# Patient Record
Sex: Male | Born: 1970 | Race: White | Hispanic: No | Marital: Married | Smoking: Never smoker
Health system: Southern US, Community
[De-identification: ages and names within clinical notes are randomized; demographics above are authoritative.]

## PROBLEM LIST (undated history)

## (undated) DIAGNOSIS — M1990 Unspecified osteoarthritis, unspecified site: Secondary | ICD-10-CM

## (undated) DIAGNOSIS — I10 Essential (primary) hypertension: Secondary | ICD-10-CM

## (undated) DIAGNOSIS — E119 Type 2 diabetes mellitus without complications: Secondary | ICD-10-CM

---

## 2000-02-27 ENCOUNTER — Emergency Department (HOSPITAL_COMMUNITY): Admission: EM | Admit: 2000-02-27 | Discharge: 2000-02-27 | Payer: Self-pay | Admitting: Emergency Medicine

## 2000-02-27 ENCOUNTER — Encounter: Payer: Self-pay | Admitting: Emergency Medicine

## 2000-04-08 HISTORY — PX: CYST EXCISION: SHX5701

## 2004-08-22 ENCOUNTER — Ambulatory Visit (HOSPITAL_COMMUNITY): Admission: RE | Admit: 2004-08-22 | Discharge: 2004-08-22 | Payer: Self-pay | Admitting: Orthopedic Surgery

## 2004-08-22 ENCOUNTER — Ambulatory Visit (HOSPITAL_BASED_OUTPATIENT_CLINIC_OR_DEPARTMENT_OTHER): Admission: RE | Admit: 2004-08-22 | Discharge: 2004-08-22 | Payer: Self-pay | Admitting: Orthopedic Surgery

## 2014-12-21 ENCOUNTER — Emergency Department: Payer: Worker's Compensation

## 2014-12-21 ENCOUNTER — Encounter: Payer: Self-pay | Admitting: Emergency Medicine

## 2014-12-21 ENCOUNTER — Emergency Department
Admission: EM | Admit: 2014-12-21 | Discharge: 2014-12-21 | Discharge status: Against Medical Advice | Payer: Worker's Compensation | Attending: Student | Admitting: Student

## 2014-12-21 DIAGNOSIS — S8002XA Contusion of left knee, initial encounter: Secondary | ICD-10-CM | POA: Diagnosis not present

## 2014-12-21 DIAGNOSIS — Y9389 Activity, other specified: Secondary | ICD-10-CM | POA: Insufficient documentation

## 2014-12-21 DIAGNOSIS — S8992XA Unspecified injury of left lower leg, initial encounter: Secondary | ICD-10-CM | POA: Diagnosis present

## 2014-12-21 DIAGNOSIS — Y990 Civilian activity done for income or pay: Secondary | ICD-10-CM | POA: Diagnosis not present

## 2014-12-21 DIAGNOSIS — W2209XA Striking against other stationary object, initial encounter: Secondary | ICD-10-CM | POA: Diagnosis not present

## 2014-12-21 DIAGNOSIS — Y9289 Other specified places as the place of occurrence of the external cause: Secondary | ICD-10-CM | POA: Insufficient documentation

## 2014-12-21 MED ORDER — TRAMADOL HCL 50 MG PO TABS: 50.0000 mg | ORAL_TABLET | Freq: Four times a day (QID) | ORAL | Status: DC | PRN

## 2014-12-21 MED ORDER — NAPROXEN 500 MG PO TABS: 500.0000 mg | ORAL_TABLET | Freq: Two times a day (BID) | ORAL | Status: DC

## 2014-12-21 NOTE — Discharge Instructions (Signed)
Ambulate with crutches for 3-5 days. Take medications as directed. Follow up with Ortho Doctor if no improvement in 2-3 days.

## 2014-12-21 NOTE — ED Provider Notes (Signed)
[facility] Emergency Department Provider Note  ____________________________________________  Time seen: Approximately 2:12 PM  I have reviewed the triage vital signs and the nursing notes.   HISTORY  Chief Complaint Knee Pain    HPI William Huang is a 44 y.o. male left knee pain secondary to contusion. Patient stated while loading 80 pound tire to the truck rolled off and hit the lateral aspect of his left knee causes knee to buckle.Patient went to urgent care clinic had x-rays done and told him was not broken but he continued having pain and edema. Patient rated his pain as a 6/10. Patient is wearing  Ace wrap around the knee.   History reviewed. No pertinent past medical history.  There are no active problems to display for this patient.   History reviewed. No pertinent past surgical history.  No current outpatient prescriptions on file.  Allergies Review of patient's allergies indicates no known allergies.  History reviewed. No pertinent family history.  Social History Social History  Substance Use Topics  . Smoking status: Never Smoker   . Smokeless tobacco: None  . Alcohol Use: None    Review of Systems Constitutional: No fever/chills Eyes: No visual changes. ENT: No sore throat. Cardiovascular: Denies chest pain. Respiratory: Denies shortness of breath. Gastrointestinal: No abdominal pain.  No nausea, no vomiting.  No diarrhea.  No constipation. Genitourinary: Negative for dysuria. Musculoskeletal: Left knee pain  Skin: Negative for rash. Neurological: Negative for headaches, focal weakness or numbness. ____________________________________________   PHYSICAL EXAM:  VITAL SIGNS: ED Triage Vitals  Enc Vitals Group     BP --      Pulse Rate 12/21/14 1340 93     Resp 12/21/14 1340 18     Temp 12/21/14 1340 97.7 F (36.5 C)     Temp Source 12/21/14 1340 Oral     SpO2 12/21/14 1340 93 %     Weight 12/21/14 1340 245 lb  (111.131 kg)     Height 12/21/14 1340  (1.727 m)     Head Cir --      Peak Flow --      Pain Score 12/21/14 1341 6     Pain Loc --      Pain Edu? --      Excl. in GC? --     Constitutional: Alert and oriented. Well appearing and in no acute distress. Eyes: Conjunctivae are normal. PERRL. EOMI. Head: Atraumatic. Nose: No congestion/rhinnorhea. Mouth/Throat: Mucous membranes are moist.  Oropharynx non-erythematous. Neck: No stridor.   Hematological/Lymphatic/Immunilogical: No cervical lymphadenopathy. Cardiovascular: Normal rate, regular rhythm. Grossly normal heart sounds.  Good peripheral circulation. Respiratory: Normal respiratory effort.  No retractions. Lungs CTAB. Gastrointestinal: Soft and nontender. No distention. No abdominal bruits. No CVA tenderness. Genitourinary:  Musculoskeletal: No lower extremity tenderness nor edema.  No joint effusions. Neurologic:  Normal speech and language. No gross focal neurologic deficits are appreciated. No gait instability. Skin:  Skin is warm, dry and intact. No rash noted. Psychiatric: Mood and affect are normal. Speech and behavior are normal.  ____________________________________________   LABS (all labs ordered are listed, but only abnormal results are displayed)  Labs Reviewed - No data to display ____________________________________________  EKG   ____________________________________________  RADIOLOGY  No acute findings on x-ray. ____________________________________________   PROCEDURES  Procedure(s) performed: None  Critical Care performed: No  ____________________________________________   INITIAL IMPRESSION / ASSESSMENT AND PLAN / ED COURSE  Pertinent labs & imaging results that were available during my  care of the patient were reviewed by me and considered in my medical decision making (see chart for details). Knee contusion. Patient left before x-ray readings were available. Patient state he will  return after he takes his daughter to the dentist. ____________________________________________   FINAL CLINICAL IMPRESSION(S) / ED DIAGNOSES  Final diagnoses:  Knee contusion, left, initial encounter      Joni Reining, PA-C 12/21/14 1601  Joni Reining, PA-C 12/21/14 1606  Gayla Doss, MD 12/22/14 [phone]

## 2014-12-21 NOTE — ED Notes (Signed)
Reports injuring left knee at work.

## 2014-12-21 NOTE — ED Notes (Signed)
Pt had prior event needs to go to at this time and can no longer wait. Pt left at this time AMA.

## 2016-02-12 ENCOUNTER — Other Ambulatory Visit: Payer: Self-pay | Admitting: Orthopedic Surgery

## 2016-02-12 DIAGNOSIS — M25562 Pain in left knee: Secondary | ICD-10-CM

## 2016-02-20 ENCOUNTER — Ambulatory Visit
Admission: RE | Admit: 2016-02-20 | Discharge: 2016-02-20 | Disposition: A | Discharge status: Home / Self Care | Payer: Worker's Compensation | Source: Ambulatory Visit | Attending: Orthopedic Surgery | Admitting: Orthopedic Surgery

## 2016-02-20 DIAGNOSIS — M25562 Pain in left knee: Secondary | ICD-10-CM

## 2016-05-09 HISTORY — PX: KNEE ARTHROSCOPY W/ MENISCAL REPAIR: SHX1877

## 2017-01-02 ENCOUNTER — Ambulatory Visit (HOSPITAL_BASED_OUTPATIENT_CLINIC_OR_DEPARTMENT_OTHER): Payer: Worker's Compensation | Admitting: Anesthesiology

## 2017-01-02 ENCOUNTER — Ambulatory Visit (HOSPITAL_BASED_OUTPATIENT_CLINIC_OR_DEPARTMENT_OTHER)
Admission: RE | Admit: 2017-01-02 | Discharge: 2017-01-02 | Disposition: A | Discharge status: Home / Self Care | Payer: Worker's Compensation | Source: Ambulatory Visit | Attending: Orthopedic Surgery | Admitting: Orthopedic Surgery

## 2017-01-02 ENCOUNTER — Other Ambulatory Visit: Payer: Self-pay | Admitting: Orthopedic Surgery

## 2017-01-02 ENCOUNTER — Encounter (HOSPITAL_BASED_OUTPATIENT_CLINIC_OR_DEPARTMENT_OTHER): Payer: Self-pay | Admitting: Anesthesiology

## 2017-01-02 ENCOUNTER — Encounter (HOSPITAL_BASED_OUTPATIENT_CLINIC_OR_DEPARTMENT_OTHER)
Admission: RE | Disposition: A | Discharge status: Home / Self Care | Payer: Self-pay | Source: Ambulatory Visit | Attending: Orthopedic Surgery

## 2017-01-02 DIAGNOSIS — Z7982 Long term (current) use of aspirin: Secondary | ICD-10-CM | POA: Diagnosis not present

## 2017-01-02 DIAGNOSIS — S62617A Displaced fracture of proximal phalanx of left little finger, initial encounter for closed fracture: Secondary | ICD-10-CM | POA: Diagnosis not present

## 2017-01-02 DIAGNOSIS — I10 Essential (primary) hypertension: Secondary | ICD-10-CM | POA: Insufficient documentation

## 2017-01-02 DIAGNOSIS — Z79891 Long term (current) use of opiate analgesic: Secondary | ICD-10-CM | POA: Insufficient documentation

## 2017-01-02 DIAGNOSIS — Z791 Long term (current) use of non-steroidal anti-inflammatories (NSAID): Secondary | ICD-10-CM | POA: Diagnosis not present

## 2017-01-02 DIAGNOSIS — E119 Type 2 diabetes mellitus without complications: Secondary | ICD-10-CM | POA: Insufficient documentation

## 2017-01-02 DIAGNOSIS — Z79899 Other long term (current) drug therapy: Secondary | ICD-10-CM | POA: Insufficient documentation

## 2017-01-02 DIAGNOSIS — Y9289 Other specified places as the place of occurrence of the external cause: Secondary | ICD-10-CM | POA: Diagnosis not present

## 2017-01-02 DIAGNOSIS — W228XXA Striking against or struck by other objects, initial encounter: Secondary | ICD-10-CM | POA: Diagnosis not present

## 2017-01-02 HISTORY — DX: Type 2 diabetes mellitus without complications: E11.9

## 2017-01-02 HISTORY — PX: CLOSED REDUCTION FINGER WITH PERCUTANEOUS PINNING: SHX5612

## 2017-01-02 HISTORY — DX: Essential (primary) hypertension: I10

## 2017-01-02 LAB — GLUCOSE, CAPILLARY
Glucose-Capillary: 250 mg/dL — ABNORMAL HIGH (ref 65–99)
Glucose-Capillary: 222 mg/dL — ABNORMAL HIGH (ref 65–99)

## 2017-01-02 SURGERY — CLOSED REDUCTION, FINGER, WITH PERCUTANEOUS PINNING
Anesthesia: General | Site: Hand | Laterality: Left

## 2017-01-02 MED ORDER — CEFAZOLIN SODIUM-DEXTROSE 2-4 GM/100ML-% IV SOLN
2.0000 g | INTRAVENOUS | Status: AC
  Administered 2017-01-02: 2 g via INTRAVENOUS

## 2017-01-02 MED ORDER — DEXAMETHASONE SODIUM PHOSPHATE 10 MG/ML IJ SOLN
INTRAMUSCULAR | Status: DC | PRN
  Administered 2017-01-02: 5 mg via INTRAVENOUS

## 2017-01-02 MED ORDER — CELECOXIB 200 MG PO CAPS
ORAL_CAPSULE | ORAL | Status: AC
  Filled 2017-01-02: qty 2

## 2017-01-02 MED ORDER — MIDAZOLAM HCL 2 MG/2ML IJ SOLN
INTRAMUSCULAR | Status: AC
  Filled 2017-01-02: qty 2

## 2017-01-02 MED ORDER — CEFAZOLIN SODIUM-DEXTROSE 2-4 GM/100ML-% IV SOLN
INTRAVENOUS | Status: AC
  Filled 2017-01-02: qty 100

## 2017-01-02 MED ORDER — SCOPOLAMINE 1 MG/3DAYS TD PT72: 1.0000 | MEDICATED_PATCH | Freq: Once | TRANSDERMAL | Status: DC | PRN

## 2017-01-02 MED ORDER — DEXAMETHASONE SODIUM PHOSPHATE 10 MG/ML IJ SOLN
INTRAMUSCULAR | Status: AC
  Filled 2017-01-02: qty 1

## 2017-01-02 MED ORDER — FENTANYL CITRATE (PF) 100 MCG/2ML IJ SOLN: 25.0000 ug | INTRAMUSCULAR | Status: DC | PRN

## 2017-01-02 MED ORDER — GABAPENTIN 300 MG PO CAPS
ORAL_CAPSULE | ORAL | Status: AC
  Filled 2017-01-02: qty 1

## 2017-01-02 MED ORDER — BUPIVACAINE HCL (PF) 0.25 % IJ SOLN
INTRAMUSCULAR | Status: AC
  Filled 2017-01-02: qty 30

## 2017-01-02 MED ORDER — GABAPENTIN 300 MG PO CAPS
300.0000 mg | ORAL_CAPSULE | Freq: Once | ORAL | Status: AC
  Administered 2017-01-02: 300 mg via ORAL

## 2017-01-02 MED ORDER — CHLORHEXIDINE GLUCONATE 4 % EX LIQD: 60.0000 mL | Freq: Once | CUTANEOUS | Status: DC

## 2017-01-02 MED ORDER — MIDAZOLAM HCL 2 MG/2ML IJ SOLN
1.0000 mg | INTRAMUSCULAR | Status: DC | PRN
  Administered 2017-01-02: 2 mg via INTRAVENOUS

## 2017-01-02 MED ORDER — ONDANSETRON HCL 4 MG/2ML IJ SOLN
INTRAMUSCULAR | Status: AC
  Filled 2017-01-02: qty 2

## 2017-01-02 MED ORDER — HYDROCODONE-ACETAMINOPHEN 5-325 MG PO TABS: ORAL_TABLET | ORAL | 0 refills | Status: AC

## 2017-01-02 MED ORDER — CELECOXIB 400 MG PO CAPS
400.0000 mg | ORAL_CAPSULE | Freq: Once | ORAL | Status: AC
  Administered 2017-01-02: 400 mg via ORAL

## 2017-01-02 MED ORDER — LACTATED RINGERS IV SOLN
INTRAVENOUS | Status: DC
  Administered 2017-01-02 (×2): via INTRAVENOUS

## 2017-01-02 MED ORDER — BUPIVACAINE HCL (PF) 0.25 % IJ SOLN
INTRAMUSCULAR | Status: DC | PRN
  Administered 2017-01-02: 8 mL

## 2017-01-02 MED ORDER — FENTANYL CITRATE (PF) 100 MCG/2ML IJ SOLN
INTRAMUSCULAR | Status: AC
  Filled 2017-01-02: qty 2

## 2017-01-02 MED ORDER — LIDOCAINE HCL (CARDIAC) 20 MG/ML IV SOLN
INTRAVENOUS | Status: DC | PRN
  Administered 2017-01-02: 40 mg via INTRAVENOUS

## 2017-01-02 MED ORDER — FENTANYL CITRATE (PF) 100 MCG/2ML IJ SOLN
50.0000 ug | INTRAMUSCULAR | Status: DC | PRN
  Administered 2017-01-02: 100 ug via INTRAVENOUS

## 2017-01-02 MED ORDER — PROPOFOL 10 MG/ML IV BOLUS
INTRAVENOUS | Status: DC | PRN
  Administered 2017-01-02: 200 mg via INTRAVENOUS

## 2017-01-02 MED ORDER — PROMETHAZINE HCL 25 MG/ML IJ SOLN: 6.2500 mg | INTRAMUSCULAR | Status: DC | PRN

## 2017-01-02 MED ORDER — ONDANSETRON HCL 4 MG/2ML IJ SOLN
INTRAMUSCULAR | Status: DC | PRN
  Administered 2017-01-02: 4 mg via INTRAVENOUS

## 2017-01-02 MED ORDER — ACETAMINOPHEN 500 MG PO TABS
ORAL_TABLET | ORAL | Status: AC
  Filled 2017-01-02: qty 2

## 2017-01-02 MED ORDER — ACETAMINOPHEN 500 MG PO TABS
1000.0000 mg | ORAL_TABLET | Freq: Once | ORAL | Status: AC
  Administered 2017-01-02: 1000 mg via ORAL

## 2017-01-02 SURGICAL SUPPLY — 55 items
BANDAGE ACE 3X5.8 VEL STRL LF (GAUZE/BANDAGES/DRESSINGS) ×1 IMPLANT
BLADE SURG 15 STRL LF DISP TIS (BLADE) ×4 IMPLANT
BLADE SURG 15 STRL SS (BLADE) ×6
BNDG CMPR 9X4 STRL LF SNTH (GAUZE/BANDAGES/DRESSINGS) ×2
BNDG ELASTIC 2X5.8 VLCR STR LF (GAUZE/BANDAGES/DRESSINGS) IMPLANT
BNDG ESMARK 4X9 LF (GAUZE/BANDAGES/DRESSINGS) ×3 IMPLANT
BNDG GAUZE ELAST 4 BULKY (GAUZE/BANDAGES/DRESSINGS) ×1 IMPLANT
CHLORAPREP W/TINT 26ML (MISCELLANEOUS) ×3 IMPLANT
CORD BIPOLAR FORCEPS 12FT (ELECTRODE) ×1 IMPLANT
COVER BACK TABLE 60X90IN (DRAPES) ×3 IMPLANT
COVER MAYO STAND STRL (DRAPES) ×3 IMPLANT
CUFF TOURNIQUET SINGLE 18IN (TOURNIQUET CUFF) ×3 IMPLANT
DRAPE EXTREMITY T 121X128X90 (DRAPE) ×3 IMPLANT
DRAPE OEC MINIVIEW 54X84 (DRAPES) ×3 IMPLANT
DRAPE SURG 17X23 STRL (DRAPES) ×3 IMPLANT
GAUZE SPONGE 4X4 12PLY STRL (GAUZE/BANDAGES/DRESSINGS) ×3 IMPLANT
GAUZE XEROFORM 1X8 LF (GAUZE/BANDAGES/DRESSINGS) ×3 IMPLANT
GLOVE BIO SURGEON STRL SZ7.5 (GLOVE) ×3 IMPLANT
GLOVE BIOGEL PI IND STRL 7.0 (GLOVE) ×2 IMPLANT
GLOVE BIOGEL PI IND STRL 8 (GLOVE) ×2 IMPLANT
GLOVE BIOGEL PI IND STRL 8.5 (GLOVE) ×1 IMPLANT
GLOVE BIOGEL PI INDICATOR 7.0 (GLOVE) ×2
GLOVE BIOGEL PI INDICATOR 8 (GLOVE) ×1
GLOVE BIOGEL PI INDICATOR 8.5 (GLOVE) ×1
GLOVE ECLIPSE 6.5 STRL STRAW (GLOVE) ×2 IMPLANT
GLOVE SURG ORTHO 8.0 STRL STRW (GLOVE) ×2 IMPLANT
GOWN STRL REUS W/ TWL LRG LVL3 (GOWN DISPOSABLE) ×2 IMPLANT
GOWN STRL REUS W/TWL LRG LVL3 (GOWN DISPOSABLE) ×3
GOWN STRL REUS W/TWL XL LVL3 (GOWN DISPOSABLE) ×5 IMPLANT
K-WIRE .035X4 (WIRE) ×4 IMPLANT
NDL HYPO 25X1 1.5 SAFETY (NEEDLE) IMPLANT
NEEDLE HYPO 25X1 1.5 SAFETY (NEEDLE) ×3 IMPLANT
NS IRRIG 1000ML POUR BTL (IV SOLUTION) ×3 IMPLANT
PACK BASIN DAY SURGERY FS (CUSTOM PROCEDURE TRAY) ×3 IMPLANT
PAD CAST 3X4 CTTN HI CHSV (CAST SUPPLIES) IMPLANT
PAD CAST 4YDX4 CTTN HI CHSV (CAST SUPPLIES) ×2 IMPLANT
PADDING CAST COTTON 3X4 STRL (CAST SUPPLIES)
PADDING CAST COTTON 4X4 STRL (CAST SUPPLIES) ×3
SLEEVE SCD COMPRESS KNEE MED (MISCELLANEOUS) ×2 IMPLANT
SPLINT PLASTER CAST XFAST 3X15 (CAST SUPPLIES) IMPLANT
SPLINT PLASTER CAST XFAST 4X15 (CAST SUPPLIES) IMPLANT
SPLINT PLASTER XTRA FAST SET 4 (CAST SUPPLIES)
SPLINT PLASTER XTRA FASTSET 3X (CAST SUPPLIES)
STOCKINETTE 4X48 STRL (DRAPES) ×3 IMPLANT
SUT CHROMIC 4 0 PS 2 18 (SUTURE) ×3 IMPLANT
SUT ETHILON 3 0 PS 1 (SUTURE) IMPLANT
SUT ETHILON 4 0 PS 2 18 (SUTURE) ×3 IMPLANT
SUT MERSILENE 4 0 P 3 (SUTURE) IMPLANT
SUT VIC AB 3-0 PS1 18 (SUTURE)
SUT VIC AB 3-0 PS1 18XBRD (SUTURE) IMPLANT
SUT VICRYL 4-0 PS2 18IN ABS (SUTURE) ×3 IMPLANT
SYR BULB 3OZ (MISCELLANEOUS) ×3 IMPLANT
SYR CONTROL 10ML LL (SYRINGE) ×2 IMPLANT
TOWEL OR 17X24 6PK STRL BLUE (TOWEL DISPOSABLE) ×6 IMPLANT
UNDERPAD 30X30 (UNDERPADS AND DIAPERS) ×3 IMPLANT

## 2017-01-02 NOTE — Op Note (Signed)
NAMEADALBERT, William Huang NO.:  [id number]  MEDICAL RECORD NO.:  [id number]  LOCATION:                                 FACILITY:  PHYSICIAN:  Betha Loa, MD             DATE OF BIRTH:  DATE OF PROCEDURE:  01/02/2017 DATE OF DISCHARGE:                              OPERATIVE REPORT   PREOPERATIVE DIAGNOSIS:  Left small finger proximal phalanx fracture.  POSTOPERATIVE DIAGNOSIS:  Left small finger proximal phalanx fracture.  PROCEDURE:  Closed reduction and pin fixation of left small finger proximal phalanx fracture.  SURGEON:  Betha Loa, MD.  ASSISTANT:  Cindee Salt, MD.  ANESTHESIA:  General.  IV FLUIDS:  Per anesthesia flow sheet.  ESTIMATED BLOOD LOSS:  Minimal.  COMPLICATIONS:  None.  SPECIMENS:  None.  TOURNIQUET TIME:  29 minutes.  DISPOSITION:  Stable to PACU.  INDICATIONS:  William Huang is a 46 year old right-hand dominant male, who states 2 days ago he dropped a tire on his left small finger at work. He was seen at urgent care where radiographs were taken revealing a fracture.  He was referred to me for further care.  I recommended operative fixation.  Risks, benefits, and alternatives of surgery were discussed including the risk of blood loss, infection, damage to nerves/vessels/tendons/ligaments/bone, failure of surgery, need for additional surgery, complications with wound healing, continued pain, nonunion, malunion, stiffness.  He voiced understanding of these risks and elected to proceed.  OPERATIVE COURSE:  After being identified preoperatively by myself, the patient and I agreed upon the procedure and site of procedure.  Surgical site was marked.  Risks, benefits, and alternatives of surgery were reviewed and he wished to proceed.  Surgical consent had been signed. He was given IV Ancef as preoperative antibiotic prophylaxis.  He was transferred to the operating room and placed on the operating room table in supine position with the  left upper extremity on an arm board. General anesthesia was induced by anesthesiologist.  The left upper extremity was prepped and draped in a normal sterile orthopedic fashion. Surgical pause was performed between surgeons, anesthesia, and operating room staff; and all were in agreement as to the patient, procedure, and site of procedure.  Tourniquet at the proximal aspect of the extremity was inflated to 250 mmHg after exsanguination of the limb with an Esmarch bandage.  C-arm was used in AP and lateral projections throughout the case.  A closed reduction of the small finger proximal phalanx was performed.  Acceptable reduction was obtained.  Two 0.035- inch K-wires were then advanced from the proximal aspect of the proximal phalanx in a crossed fashion across the fracture site into the distal condyles of the proximal phalanx.  This was adequate to stabilize the fracture.  The wrist was placed through tenodesis and there was no scissoring.  The pins were bent and cut short.  The pin sites were dressed with sterile Xeroform and 4 x 4's and wrapped with a Kerlix bandage.  A volar and dorsal slab splint including the long, ring, and small fingers was placed with the MPs flexed and the IPs extended.  This was wrapped  with Kerlix and Ace bandage.  Tourniquet was deflated at 29 minutes.  Fingertips were pink with brisk capillary refill after deflation of tourniquet.  Operative drapes were broken down, and the patient was awakened from anesthesia safely.  He was transferred back to stretcher and taken to PACU in stable condition.  I will see him back in the office in 1 week for postoperative followup.  I will give him Norco 5/325 one to two p.o. q.6 hours p.r.n. pain, dispensed #30.     Betha Loa, MD     KK/MEDQ  D:  01/02/2017  T:  01/02/2017  Job:  [id number]

## 2017-01-02 NOTE — Anesthesia Procedure Notes (Signed)
Procedure Name: LMA Insertion Date/Time: 01/02/2017 2:07 PM Performed by: Burna Cash Pre-anesthesia Checklist: Patient identified, Emergency Drugs available, Suction available and Patient being monitored Patient Re-evaluated:Patient Re-evaluated prior to induction Oxygen Delivery Method: Circle system utilized Preoxygenation: Pre-oxygenation with 100% oxygen Induction Type: IV induction Ventilation: Mask ventilation without difficulty LMA: LMA inserted LMA Size: 5.0 Number of attempts: 1 Airway Equipment and Method: Bite block Placement Confirmation: positive ETCO2 Tube secured with: Tape Dental Injury: Teeth and Oropharynx as per pre-operative assessment

## 2017-01-02 NOTE — Anesthesia Postprocedure Evaluation (Signed)
Anesthesia Post Note  Patient: William Huang  Procedure(s) Performed: Procedure(s) (LRB): CLOSED REDUCTION FINGER WITH PERCUTANEOUS PINNING LEFT SMALL FINGER (Left)     Patient location during evaluation: PACU Anesthesia Type: General Level of consciousness: awake and alert Pain management: pain level controlled Vital Signs Assessment: post-procedure vital signs reviewed and stable Respiratory status: spontaneous breathing, nonlabored ventilation, respiratory function stable and patient connected to nasal cannula oxygen Cardiovascular status: blood pressure returned to baseline and stable Postop Assessment: no apparent nausea or vomiting Anesthetic complications: no    Last Vitals:  Vitals:   01/02/17 1530 01/02/17 1545  BP: 122/74 133/81  Pulse: 60 (!) 58  Resp: 13 14  Temp:    SpO2: 97% 92%    Last Pain:  Vitals:   01/02/17 1530  TempSrc:   PainSc: 0-No pain                 Kennieth Rad

## 2017-01-02 NOTE — H&P (Signed)
William Huang is an 46 y.o. male.   Chief Complaint: left small finger fracture HPI: 46 yo rhd male states he smashed left small finger under tire at work two days ago.  Seen at [facility] and found to have fracture proximal phalanx.  No reported previous injury to left small finger.  He describes a throbbing pain that can reach 10/10 severity.  Alleviated by rest/immobilization and aggravated by movement.  Xrays viewed and interpreted by me: 3 views left small finger show comminuted fracture of distal aspect proximal phalanx with displacement. Labs reviewed: none  Allergies: No Known Allergies  Past Medical History:  Diagnosis Date  . Diabetes mellitus without complication (HCC)   . Hypertension     Past Surgical History:  Procedure Laterality Date  . CYST EXCISION Right 04/2000  . KNEE ARTHROSCOPY W/ MENISCAL REPAIR Left 05/09/2016    Family History: History reviewed. No pertinent family history.  Social History:   reports that he has never smoked. He has never used smokeless tobacco. He reports that he does not drink alcohol or use drugs.  Medications: Medications Prior to Admission  Medication Sig Dispense Refill  . amlodipine-atorvastatin (CADUET) 10-10 MG tablet Take 1 tablet by mouth daily.    Marland Kitchen aspirin 81 MG chewable tablet Chew by mouth daily.    . bisoprolol-hydrochlorothiazide (ZIAC) 5-6.25 MG tablet Take 1 tablet by mouth daily.    . cyclobenzaprine (FLEXERIL) 5 MG tablet Take 5 mg by mouth 3 (three) times daily as needed for muscle spasms.    . enalapril (VASOTEC) 10 MG tablet Take 10 mg by mouth daily.    Marland Kitchen glimepiride (AMARYL) 4 MG tablet Take 4 mg by mouth daily with breakfast.    . HYDROcodone-acetaminophen (NORCO/VICODIN) 5-325 MG tablet Take 1 tablet by mouth every 6 (six) hours as needed for moderate pain.    Marland Kitchen ibuprofen (ADVIL,MOTRIN) 600 MG tablet Take 600 mg by mouth every 6 (six) hours as needed.    . metFORMIN (GLUCOPHAGE) 1000 MG tablet Take 1,000 mg by  mouth 2 (two) times daily with a meal.    . Multiple Vitamin (MULTIVITAMIN) tablet Take 1 tablet by mouth daily.    . naproxen (NAPROSYN) 500 MG tablet Take 1 tablet (500 mg total) by mouth 2 (two) times daily with a meal. 20 tablet 0  . traMADol (ULTRAM) 50 MG tablet Take 1 tablet (50 mg total) by mouth every 6 (six) hours as needed for moderate pain. 12 tablet 0    Results for orders placed or performed during the hospital encounter of 01/02/17 (from the past 48 hour(s))  Glucose, capillary     Status: Abnormal   Collection Time: 01/02/17  1:21 PM  Result Value Ref Range   Glucose-Capillary 250 (H) 65 - 99 mg/dL    No results found.   A comprehensive review of systems was negative. Review of Systems: No fevers, chills, night sweats, chest pain, shortness of breath, nausea, vomiting, diarrhea, constipation, easy bleeding or bruising, headaches, dizziness, vision changes, fainting.   Blood pressure 137/79, pulse 60, temperature 98 F (36.7 C), temperature source Oral, resp. rate 16, height  (1.727 m), weight 111.7 kg (246 lb 3.2 oz), SpO2 100 %.  General appearance: alert, cooperative and appears stated age Head: Normocephalic, without obvious abnormality, atraumatic Neck: supple, symmetrical, trachea midline Resp: clear to auscultation bilaterally Cardio: regular rate and rhythm GI: non-tender Extremities: Intact sensation and capillary refill all digits.  +epl/fpl/io.  No wounds.  Pulses: 2+ and  symmetric Skin: Skin color, texture, turgor normal. No rashes or lesions Neurologic: Grossly normal Incision/Wound:  none  Assessment/Plan Left small finger proximal phalanx fracture.  Recommend OR for reduction and pinning.  Likely closed with open if necessary.  Risks, benefits and alternatives of surgery were discussed including risks of blood loss, infection, damage to nerves/vessels/tendons/ligament/bone, failure of surgery, need for additional surgery, complication with wound  healing, nonunion, malunion, stiffness.  He voiced understanding of these risks and elected to proceed.    Tommaso Cavitt R 01/02/2017, 1:56 PM

## 2017-01-02 NOTE — Anesthesia Preprocedure Evaluation (Addendum)
Anesthesia Evaluation  Patient identified by MRN, date of birth, ID band Patient awake    Reviewed: Allergy & Precautions, NPO status , Patient's Chart, lab work & pertinent test results, reviewed documented beta blocker date and time   History of Anesthesia Complications Negative for: history of anesthetic complications  Airway Mallampati: II  TM Distance: >3 FB Neck ROM: Full    Dental  (+) Teeth Intact, Dental Advisory Given, Poor Dentition   Pulmonary neg pulmonary ROS,    Pulmonary exam normal breath sounds clear to auscultation       Cardiovascular hypertension, Pt. on home beta blockers and Pt. on medications Normal cardiovascular exam Rhythm:Regular Rate:Normal     Neuro/Psych negative neurological ROS     GI/Hepatic negative GI ROS, Neg liver ROS,   Endo/Other  diabetes, Type 2, Oral Hypoglycemic AgentsObesity   Renal/GU negative Renal ROS     Musculoskeletal Crush injury left small finger   Abdominal   Peds  Hematology negative hematology ROS (+)   Anesthesia Other Findings Day of surgery medications reviewed with the patient.  Reproductive/Obstetrics                            Anesthesia Physical Anesthesia Plan  ASA: II  Anesthesia Plan: General   Post-op Pain Management:    Induction: Intravenous  PONV Risk Score and Plan: 2 and Ondansetron and Dexamethasone  Airway Management Planned: LMA  Additional Equipment:   Intra-op Plan:   Post-operative Plan: Extubation in OR  Informed Consent: I have reviewed the patients History and Physical, chart, labs and discussed the procedure including the risks, benefits and alternatives for the proposed anesthesia with the patient or authorized representative who has indicated his/her understanding and acceptance.   Dental advisory given  Plan Discussed with: CRNA  Anesthesia Plan Comments: (Risks/benefits of general  anesthesia discussed with patient including risk of damage to teeth, lips, gum, and tongue, nausea/vomiting, allergic reactions to medications, and the possibility of heart attack, stroke and death.  All patient questions answered.  Patient wishes to proceed.)        Anesthesia Quick Evaluation

## 2017-01-02 NOTE — Discharge Instructions (Addendum)

## 2017-01-02 NOTE — Brief Op Note (Signed)
01/02/2017  2:51 PM  PATIENT:  Kandis Fantasia  46 y.o. male  PRE-OPERATIVE DIAGNOSIS:  Left small finger proximal phalanx fracture  POST-OPERATIVE DIAGNOSIS:  Left small finger proximal phalanx fracture  PROCEDURE:  Procedure(s): CLOSED REDUCTION FINGER WITH PERCUTANEOUS PINNING LEFT SMALL FINGER (Left)  SURGEON:  Surgeon(s) and Role:    Betha Loa, MD - Primary  PHYSICIAN ASSISTANT:   ASSISTANTS: Cindee Salt, MD   ANESTHESIA:   general  EBL:  No intake/output data recorded.  BLOOD ADMINISTERED:none  DRAINS: none   LOCAL MEDICATIONS USED:  MARCAINE     SPECIMEN:  No Specimen  DISPOSITION OF SPECIMEN:  N/A  COUNTS:  YES  TOURNIQUET:   Total Tourniquet Time Documented: Upper Arm (Left) - 29 minutes Total: Upper Arm (Left) - 29 minutes   DICTATION: .Other Dictation: Dictation Number [id number]  PLAN OF CARE: Discharge to home after PACU  PATIENT DISPOSITION:  PACU - hemodynamically stable.

## 2017-01-02 NOTE — Transfer of Care (Signed)
Immediate Anesthesia Transfer of Care Note  Patient: William Huang  Procedure(s) Performed: Procedure(s): CLOSED REDUCTION FINGER WITH PERCUTANEOUS PINNING LEFT SMALL FINGER (Left)  Patient Location: PACU  Anesthesia Type:General  Level of Consciousness: sedated  Airway & Oxygen Therapy: Patient Spontanous Breathing and Patient connected to face mask oxygen  Post-op Assessment: Report given to RN and Post -op Vital signs reviewed and stable  Post vital signs: Reviewed and stable  Last Vitals:  Vitals:   01/02/17 1318 01/02/17 1457  BP: 137/79   Pulse: 60 74  Resp: 16 12  Temp: 36.7 C (P) 36.4 C  SpO2: 100% 97%    Last Pain:  Vitals:   01/02/17 1318  TempSrc: Oral  PainSc: 0-No pain         Complications: No apparent anesthesia complications

## 2017-01-02 NOTE — Op Note (Signed)
I assisted Surgeon(s) and Role:    Betha Loa, MD - Primary on the Procedure(s): CLOSED REDUCTION FINGER WITH PERCUTANEOUS PINNING LEFT SMALL FINGER on 01/02/2017.  I provided assistance on this case as follows: setup,. Reduction, stabilization, application of dressing and splints.   Electronically signed by: Nicki Reaper, MD Date: 01/02/2017 Time: 2:52 PM

## 2017-01-03 ENCOUNTER — Encounter (HOSPITAL_BASED_OUTPATIENT_CLINIC_OR_DEPARTMENT_OTHER): Payer: Self-pay | Admitting: Orthopedic Surgery

## 2017-06-11 ENCOUNTER — Other Ambulatory Visit: Payer: Self-pay | Admitting: Internal Medicine

## 2017-06-11 DIAGNOSIS — E1121 Type 2 diabetes mellitus with diabetic nephropathy: Secondary | ICD-10-CM

## 2017-06-18 ENCOUNTER — Ambulatory Visit
Admission: RE | Admit: 2017-06-18 | Discharge: 2017-06-18 | Disposition: A | Discharge status: Home / Self Care | Payer: Managed Care, Other (non HMO) | Source: Ambulatory Visit | Attending: Internal Medicine | Admitting: Internal Medicine

## 2017-06-18 DIAGNOSIS — N281 Cyst of kidney, acquired: Secondary | ICD-10-CM | POA: Insufficient documentation

## 2017-06-18 DIAGNOSIS — E1121 Type 2 diabetes mellitus with diabetic nephropathy: Secondary | ICD-10-CM | POA: Insufficient documentation

## 2017-06-19 ENCOUNTER — Other Ambulatory Visit: Payer: Self-pay | Admitting: Internal Medicine

## 2017-06-19 DIAGNOSIS — M545 Low back pain: Principal | ICD-10-CM

## 2017-06-19 DIAGNOSIS — G8929 Other chronic pain: Secondary | ICD-10-CM

## 2017-06-28 ENCOUNTER — Ambulatory Visit
Admission: RE | Admit: 2017-06-28 | Discharge: 2017-06-28 | Disposition: A | Discharge status: Home / Self Care | Payer: Managed Care, Other (non HMO) | Source: Ambulatory Visit | Attending: Internal Medicine | Admitting: Internal Medicine

## 2017-06-28 DIAGNOSIS — M47816 Spondylosis without myelopathy or radiculopathy, lumbar region: Secondary | ICD-10-CM | POA: Diagnosis not present

## 2017-06-28 DIAGNOSIS — M48061 Spinal stenosis, lumbar region without neurogenic claudication: Secondary | ICD-10-CM | POA: Diagnosis not present

## 2017-06-28 DIAGNOSIS — M545 Low back pain: Secondary | ICD-10-CM | POA: Diagnosis present

## 2017-06-28 DIAGNOSIS — G8929 Other chronic pain: Secondary | ICD-10-CM | POA: Diagnosis not present

## 2018-05-07 ENCOUNTER — Other Ambulatory Visit: Payer: Self-pay | Admitting: Orthopedic Surgery

## 2018-05-27 ENCOUNTER — Other Ambulatory Visit: Payer: Self-pay

## 2018-05-27 ENCOUNTER — Encounter (HOSPITAL_BASED_OUTPATIENT_CLINIC_OR_DEPARTMENT_OTHER): Payer: Self-pay | Admitting: *Deleted

## 2018-06-02 ENCOUNTER — Encounter (HOSPITAL_COMMUNITY): Payer: Self-pay | Admitting: Anesthesiology

## 2018-06-02 ENCOUNTER — Encounter (HOSPITAL_BASED_OUTPATIENT_CLINIC_OR_DEPARTMENT_OTHER)
Admission: RE | Admit: 2018-06-02 | Discharge: 2018-06-02 | Disposition: A | Discharge status: Home / Self Care | Payer: No Typology Code available for payment source | Source: Ambulatory Visit | Attending: Orthopedic Surgery | Admitting: Orthopedic Surgery

## 2018-06-02 DIAGNOSIS — Z01818 Encounter for other preprocedural examination: Secondary | ICD-10-CM | POA: Insufficient documentation

## 2018-06-02 LAB — BASIC METABOLIC PANEL
ANION GAP: 8 (ref 5–15)
BUN: 21 mg/dL — AB (ref 6–20)
CHLORIDE: 103 mmol/L (ref 98–111)
CO2: 25 mmol/L (ref 22–32)
Calcium: 8.8 mg/dL — ABNORMAL LOW (ref 8.9–10.3)
Creatinine, Ser: 1.32 mg/dL — ABNORMAL HIGH (ref 0.61–1.24)
GFR calc Af Amer: >60 mL/min (ref >60)
Glucose, Bld: 275 mg/dL — ABNORMAL HIGH (ref 70–99)
POTASSIUM: 4.2 mmol/L (ref 3.5–5.1)
SODIUM: 136 mmol/L (ref 135–145)

## 2018-06-02 NOTE — Progress Notes (Signed)
Labs reviewed by Dr. Turk, will proceed with surgery as scheduled. 

## 2018-06-04 ENCOUNTER — Ambulatory Visit (HOSPITAL_BASED_OUTPATIENT_CLINIC_OR_DEPARTMENT_OTHER)
Admission: RE | Admit: 2018-06-04 | Payer: No Typology Code available for payment source | Source: Home / Self Care | Admitting: Orthopedic Surgery

## 2018-06-04 HISTORY — DX: Unspecified osteoarthritis, unspecified site: M19.90

## 2018-06-04 SURGERY — REPAIR, LIGAMENT
Anesthesia: Choice | Laterality: Left

## 2018-10-27 ENCOUNTER — Other Ambulatory Visit: Payer: Self-pay | Admitting: Orthopedic Surgery

## 2018-10-27 NOTE — Addendum Note (Signed)
Addended by: Leanora Cover on: 10/27/2018 12:45 PM   Modules accepted: Orders

## 2018-11-04 ENCOUNTER — Encounter (HOSPITAL_BASED_OUTPATIENT_CLINIC_OR_DEPARTMENT_OTHER): Payer: Self-pay | Admitting: *Deleted

## 2018-11-04 ENCOUNTER — Other Ambulatory Visit: Payer: Self-pay

## 2018-11-09 ENCOUNTER — Encounter (HOSPITAL_BASED_OUTPATIENT_CLINIC_OR_DEPARTMENT_OTHER)
Admission: RE | Admit: 2018-11-09 | Discharge: 2018-11-09 | Disposition: A | Discharge status: Home / Self Care | Payer: Managed Care, Other (non HMO) | Source: Ambulatory Visit | Attending: Orthopedic Surgery | Admitting: Orthopedic Surgery

## 2018-11-09 ENCOUNTER — Other Ambulatory Visit (HOSPITAL_COMMUNITY)
Admission: RE | Admit: 2018-11-09 | Discharge: 2018-11-09 | Disposition: A | Discharge status: Home / Self Care | Payer: Managed Care, Other (non HMO) | Source: Ambulatory Visit | Attending: Orthopedic Surgery | Admitting: Orthopedic Surgery

## 2018-11-09 ENCOUNTER — Other Ambulatory Visit: Payer: Self-pay

## 2018-11-09 DIAGNOSIS — Z01812 Encounter for preprocedural laboratory examination: Secondary | ICD-10-CM | POA: Insufficient documentation

## 2018-11-09 DIAGNOSIS — Z20828 Contact with and (suspected) exposure to other viral communicable diseases: Secondary | ICD-10-CM | POA: Insufficient documentation

## 2018-11-09 LAB — BASIC METABOLIC PANEL
Anion gap: 9 (ref 5–15)
BUN: 15 mg/dL (ref 6–20)
CO2: 27 mmol/L (ref 22–32)
Calcium: 9.1 mg/dL (ref 8.9–10.3)
Chloride: 101 mmol/L (ref 98–111)
Creatinine, Ser: 1.13 mg/dL (ref 0.61–1.24)
GFR calc Af Amer: >60 mL/min (ref >60)
GFR calc non Af Amer: >60 mL/min (ref >60)
Glucose, Bld: 182 mg/dL — ABNORMAL HIGH (ref 70–99)
Potassium: 4.3 mmol/L (ref 3.5–5.1)
Sodium: 137 mmol/L (ref 135–145)

## 2018-11-09 LAB — SARS CORONAVIRUS 2 (TAT 6-24 HRS): SARS Coronavirus 2: NEGATIVE

## 2018-11-09 NOTE — Progress Notes (Signed)
Gatorade 2 drink given with instructions, pt verbalized understanding.

## 2018-11-12 ENCOUNTER — Ambulatory Visit (HOSPITAL_BASED_OUTPATIENT_CLINIC_OR_DEPARTMENT_OTHER): Payer: No Typology Code available for payment source | Admitting: Anesthesiology

## 2018-11-12 ENCOUNTER — Encounter (HOSPITAL_BASED_OUTPATIENT_CLINIC_OR_DEPARTMENT_OTHER): Payer: Self-pay

## 2018-11-12 ENCOUNTER — Other Ambulatory Visit: Payer: Self-pay

## 2018-11-12 ENCOUNTER — Ambulatory Visit (HOSPITAL_BASED_OUTPATIENT_CLINIC_OR_DEPARTMENT_OTHER)
Admission: RE | Admit: 2018-11-12 | Discharge: 2018-11-12 | Disposition: A | Discharge status: Home / Self Care | Payer: No Typology Code available for payment source | Attending: Orthopedic Surgery | Admitting: Orthopedic Surgery

## 2018-11-12 ENCOUNTER — Encounter (HOSPITAL_BASED_OUTPATIENT_CLINIC_OR_DEPARTMENT_OTHER)
Admission: RE | Disposition: A | Discharge status: Home / Self Care | Payer: Self-pay | Source: Home / Self Care | Attending: Orthopedic Surgery

## 2018-11-12 DIAGNOSIS — S63657A Sprain of metacarpophalangeal joint of left little finger, initial encounter: Secondary | ICD-10-CM | POA: Diagnosis not present

## 2018-11-12 DIAGNOSIS — Z7982 Long term (current) use of aspirin: Secondary | ICD-10-CM | POA: Insufficient documentation

## 2018-11-12 DIAGNOSIS — E119 Type 2 diabetes mellitus without complications: Secondary | ICD-10-CM | POA: Insufficient documentation

## 2018-11-12 DIAGNOSIS — Z7984 Long term (current) use of oral hypoglycemic drugs: Secondary | ICD-10-CM | POA: Diagnosis not present

## 2018-11-12 DIAGNOSIS — Z791 Long term (current) use of non-steroidal anti-inflammatories (NSAID): Secondary | ICD-10-CM | POA: Insufficient documentation

## 2018-11-12 DIAGNOSIS — M170 Bilateral primary osteoarthritis of knee: Secondary | ICD-10-CM | POA: Diagnosis not present

## 2018-11-12 DIAGNOSIS — X58XXXA Exposure to other specified factors, initial encounter: Secondary | ICD-10-CM | POA: Insufficient documentation

## 2018-11-12 DIAGNOSIS — I10 Essential (primary) hypertension: Secondary | ICD-10-CM | POA: Diagnosis not present

## 2018-11-12 DIAGNOSIS — Z79899 Other long term (current) drug therapy: Secondary | ICD-10-CM | POA: Insufficient documentation

## 2018-11-12 HISTORY — PX: LIGAMENT REPAIR: SHX5444

## 2018-11-12 LAB — GLUCOSE, CAPILLARY
Glucose-Capillary: 206 mg/dL — ABNORMAL HIGH (ref 70–99)
Glucose-Capillary: 253 mg/dL — ABNORMAL HIGH (ref 70–99)

## 2018-11-12 SURGERY — REPAIR, LIGAMENT
Anesthesia: Monitor Anesthesia Care | Site: Hand | Laterality: Left

## 2018-11-12 MED ORDER — CEFAZOLIN SODIUM-DEXTROSE 2-4 GM/100ML-% IV SOLN
INTRAVENOUS | Status: AC
  Filled 2018-11-12: qty 100

## 2018-11-12 MED ORDER — OXYCODONE HCL 5 MG PO TABS: 5.0000 mg | ORAL_TABLET | Freq: Once | ORAL | Status: DC | PRN

## 2018-11-12 MED ORDER — MIDAZOLAM HCL 2 MG/2ML IJ SOLN
INTRAMUSCULAR | Status: AC
  Filled 2018-11-12: qty 2

## 2018-11-12 MED ORDER — ROPIVACAINE HCL 5 MG/ML IJ SOLN
INTRAMUSCULAR | Status: DC | PRN
  Administered 2018-11-12: 30 mL via PERINEURAL

## 2018-11-12 MED ORDER — FENTANYL CITRATE (PF) 100 MCG/2ML IJ SOLN
50.0000 ug | INTRAMUSCULAR | Status: DC | PRN
  Administered 2018-11-12: 100 ug via INTRAVENOUS

## 2018-11-12 MED ORDER — CHLORHEXIDINE GLUCONATE 4 % EX LIQD: 60.0000 mL | Freq: Once | CUTANEOUS | Status: DC

## 2018-11-12 MED ORDER — CEFAZOLIN SODIUM-DEXTROSE 2-4 GM/100ML-% IV SOLN
2.0000 g | INTRAVENOUS | Status: AC
  Administered 2018-11-12: 2 g via INTRAVENOUS

## 2018-11-12 MED ORDER — CLONIDINE HCL (ANALGESIA) 100 MCG/ML EP SOLN
EPIDURAL | Status: DC | PRN
  Administered 2018-11-12: 100 ug

## 2018-11-12 MED ORDER — FENTANYL CITRATE (PF) 100 MCG/2ML IJ SOLN: 25.0000 ug | INTRAMUSCULAR | Status: DC | PRN

## 2018-11-12 MED ORDER — LACTATED RINGERS IV SOLN
INTRAVENOUS | Status: DC
  Administered 2018-11-12: 11:00:00 via INTRAVENOUS

## 2018-11-12 MED ORDER — PROPOFOL 500 MG/50ML IV EMUL
INTRAVENOUS | Status: DC | PRN
  Administered 2018-11-12: 150 ug/kg/min via INTRAVENOUS

## 2018-11-12 MED ORDER — PROPOFOL 10 MG/ML IV BOLUS
INTRAVENOUS | Status: DC | PRN
  Administered 2018-11-12: 20 mg via INTRAVENOUS
  Administered 2018-11-12: 30 mg via INTRAVENOUS

## 2018-11-12 MED ORDER — MIDAZOLAM HCL 2 MG/2ML IJ SOLN
1.0000 mg | INTRAMUSCULAR | Status: DC | PRN
  Administered 2018-11-12: 2 mg via INTRAVENOUS

## 2018-11-12 MED ORDER — OXYCODONE HCL 5 MG/5ML PO SOLN: 5.0000 mg | Freq: Once | ORAL | Status: DC | PRN

## 2018-11-12 MED ORDER — ONDANSETRON HCL 4 MG/2ML IJ SOLN: 4.0000 mg | Freq: Once | INTRAMUSCULAR | Status: DC | PRN

## 2018-11-12 MED ORDER — FENTANYL CITRATE (PF) 100 MCG/2ML IJ SOLN
INTRAMUSCULAR | Status: AC
  Filled 2018-11-12: qty 2

## 2018-11-12 MED ORDER — SCOPOLAMINE 1 MG/3DAYS TD PT72: 1.0000 | MEDICATED_PATCH | Freq: Once | TRANSDERMAL | Status: DC

## 2018-11-12 MED ORDER — ONDANSETRON HCL 4 MG/2ML IJ SOLN
INTRAMUSCULAR | Status: DC | PRN
  Administered 2018-11-12: 4 mg via INTRAVENOUS

## 2018-11-12 MED ORDER — HYDROCODONE-ACETAMINOPHEN 5-325 MG PO TABS: ORAL_TABLET | ORAL | 0 refills | Status: AC

## 2018-11-12 SURGICAL SUPPLY — 68 items
ANCH SUT 2-0 MN NDL DRL PLSTR (Anchor) ×2 IMPLANT
ANCHOR JUGGERKNOT 1.0 1DR 2-0 (Anchor) ×2 IMPLANT
APL PRP STRL LF DISP 70% ISPRP (MISCELLANEOUS) ×1
BLADE MINI RND TIP GREEN BEAV (BLADE) ×2 IMPLANT
BLADE SURG 15 STRL LF DISP TIS (BLADE) ×2 IMPLANT
BLADE SURG 15 STRL SS (BLADE) ×4
BNDG CMPR 9X4 STRL LF SNTH (GAUZE/BANDAGES/DRESSINGS) ×1
BNDG ELASTIC 2X5.8 VLCR STR LF (GAUZE/BANDAGES/DRESSINGS) IMPLANT
BNDG ELASTIC 3X5.8 VLCR STR LF (GAUZE/BANDAGES/DRESSINGS) IMPLANT
BNDG ESMARK 4X9 LF (GAUZE/BANDAGES/DRESSINGS) ×2 IMPLANT
BNDG GAUZE ELAST 4 BULKY (GAUZE/BANDAGES/DRESSINGS) ×2 IMPLANT
CATH ROBINSON RED A/P 8FR (CATHETERS) IMPLANT
CHLORAPREP W/TINT 26 (MISCELLANEOUS) ×2 IMPLANT
CORD BIPOLAR FORCEPS 12FT (ELECTRODE) ×2 IMPLANT
COVER BACK TABLE REUSABLE LG (DRAPES) ×2 IMPLANT
COVER MAYO STAND REUSABLE (DRAPES) ×2 IMPLANT
COVER WAND RF STERILE (DRAPES) IMPLANT
CUFF TOURN SGL QUICK 18X4 (TOURNIQUET CUFF) ×2 IMPLANT
DECANTER SPIKE VIAL GLASS SM (MISCELLANEOUS) IMPLANT
DRAPE EXTREMITY T 121X128X90 (DISPOSABLE) ×2 IMPLANT
DRAPE OEC MINIVIEW 54X84 (DRAPES) ×1 IMPLANT
DRAPE SURG 17X23 STRL (DRAPES) ×2 IMPLANT
DRSG PAD ABDOMINAL 8X10 ST (GAUZE/BANDAGES/DRESSINGS) IMPLANT
GAUZE SPONGE 4X4 12PLY STRL (GAUZE/BANDAGES/DRESSINGS) ×2 IMPLANT
GAUZE XEROFORM 1X8 LF (GAUZE/BANDAGES/DRESSINGS) ×2 IMPLANT
GLOVE BIO SURGEON STRL SZ7.5 (GLOVE) ×2 IMPLANT
GLOVE BIOGEL PI IND STRL 8 (GLOVE) ×1 IMPLANT
GLOVE BIOGEL PI INDICATOR 8 (GLOVE) ×1
GLOVE SURG ORTHO 8.0 STRL STRW (GLOVE) ×1 IMPLANT
GOWN STRL REUS W/ TWL LRG LVL3 (GOWN DISPOSABLE) ×1 IMPLANT
GOWN STRL REUS W/TWL LRG LVL3 (GOWN DISPOSABLE) ×2
GOWN STRL REUS W/TWL XL LVL3 (GOWN DISPOSABLE) ×2 IMPLANT
K-WIRE .035X4 (WIRE) IMPLANT
NDL HYPO 25X1 1.5 SAFETY (NEEDLE) IMPLANT
NDL KEITH (NEEDLE) IMPLANT
NDL SAFETY ECLIPSE 18X1.5 (NEEDLE) IMPLANT
NEEDLE HYPO 18GX1.5 SHARP (NEEDLE)
NEEDLE HYPO 25X1 1.5 SAFETY (NEEDLE) IMPLANT
NEEDLE KEITH (NEEDLE) IMPLANT
NS IRRIG 1000ML POUR BTL (IV SOLUTION) ×2 IMPLANT
PACK BASIN DAY SURGERY FS (CUSTOM PROCEDURE TRAY) ×2 IMPLANT
PAD CAST 3X4 CTTN HI CHSV (CAST SUPPLIES) ×1 IMPLANT
PAD CAST 4YDX4 CTTN HI CHSV (CAST SUPPLIES) IMPLANT
PADDING CAST ABS 4INX4YD NS (CAST SUPPLIES) ×1
PADDING CAST ABS COTTON 4X4 ST (CAST SUPPLIES) ×1 IMPLANT
PADDING CAST COTTON 3X4 STRL (CAST SUPPLIES) ×2
PADDING CAST COTTON 4X4 STRL (CAST SUPPLIES)
PASSER SUT SWANSON 36MM LOOP (INSTRUMENTS) IMPLANT
SLEEVE SCD COMPRESS KNEE MED (MISCELLANEOUS) ×1 IMPLANT
SLING ARM FOAM STRAP LRG (SOFTGOODS) ×1 IMPLANT
SPLINT PLASTER CAST XFAST 3X15 (CAST SUPPLIES) ×1 IMPLANT
SPLINT PLASTER XTRA FASTSET 3X (CAST SUPPLIES) ×1
STOCKINETTE 4X48 STRL (DRAPES) ×2 IMPLANT
SUT CHROMIC 4 0 P 3 18 (SUTURE) ×1 IMPLANT
SUT ETHIBOND 3-0 V-5 (SUTURE) IMPLANT
SUT ETHILON 3 0 PS 1 (SUTURE) IMPLANT
SUT ETHILON 4 0 PS 2 18 (SUTURE) ×1 IMPLANT
SUT FIBERWIRE 2-0 18 17.9 3/8 (SUTURE)
SUT MERSILENE 2.0 SH NDLE (SUTURE) IMPLANT
SUT MERSILENE 4 0 P 3 (SUTURE) ×1 IMPLANT
SUT SILK 4 0 PS 2 (SUTURE) IMPLANT
SUT VIC AB 0 SH 27 (SUTURE) IMPLANT
SUT VICRYL 4-0 PS2 18IN ABS (SUTURE) IMPLANT
SUTURE FIBERWR 2-0 18 17.9 3/8 (SUTURE) IMPLANT
SYR BULB 3OZ (MISCELLANEOUS) ×2 IMPLANT
SYR CONTROL 10ML LL (SYRINGE) IMPLANT
TOWEL GREEN STERILE FF (TOWEL DISPOSABLE) ×4 IMPLANT
UNDERPAD 30X30 (UNDERPADS AND DIAPERS) ×2 IMPLANT

## 2018-11-12 NOTE — Anesthesia Postprocedure Evaluation (Signed)
Anesthesia Post Note  Patient: William Huang  Procedure(s) Performed: LEFT SMALL FINGER COLLATERAL LIGAMENT REPAIR VERSES RECONSTRUCTION (Left Hand)     Patient location during evaluation: PACU Anesthesia Type: Regional Level of consciousness: awake and alert Pain management: pain level controlled Vital Signs Assessment: post-procedure vital signs reviewed and stable Respiratory status: spontaneous breathing, nonlabored ventilation and respiratory function stable Cardiovascular status: blood pressure returned to baseline and stable Postop Assessment: no apparent nausea or vomiting Anesthetic complications: no    Last Vitals:  Vitals:   11/12/18 1315 11/12/18 1330  BP: 132/79 125/71  Pulse: (!) 56 (!) 59  Resp: 18 16  Temp:  36.5 C  SpO2: 95% 95%    Last Pain:  Vitals:   11/12/18 1330  TempSrc:   PainSc: 0-No pain                 Lidia Collum

## 2018-11-12 NOTE — Progress Notes (Signed)
Assisted Dr. Witman with left, ultrasound guided, supraclavicular block. Side rails up, monitors on throughout procedure. See vital signs in flow sheet. Tolerated Procedure well. °

## 2018-11-12 NOTE — Transfer of Care (Signed)
Immediate Anesthesia Transfer of Care Note  Patient: William Huang  Procedure(s) Performed: LEFT SMALL FINGER COLLATERAL LIGAMENT REPAIR VERSES RECONSTRUCTION (Left Hand)  Patient Location: PACU  Anesthesia Type:MAC and Regional  Level of Consciousness: awake, alert  and oriented  Airway & Oxygen Therapy: Patient Spontanous Breathing and Patient connected to face mask oxygen  Post-op Assessment: Report given to RN  Post vital signs: Reviewed  Last Vitals:  Vitals Value Taken Time  BP 115/69 11/12/18 1257  Temp    Pulse 58 11/12/18 1258  Resp 18 11/12/18 1258  SpO2 98 % 11/12/18 1258  Vitals shown include unvalidated device data.  Last Pain:  Vitals:   11/12/18 1032  TempSrc: Oral  PainSc: 0-No pain      Patients Stated Pain Goal: 4 (27/07/86 7544)  Complications: No apparent anesthesia complications

## 2018-11-12 NOTE — Discharge Instructions (Addendum)

## 2018-11-12 NOTE — H&P (Signed)
  William Huang is an 48 y.o. male.   Chief Complaint: left small finger ligament injury HPI: 48 yo male s/p left small finger fracture with continued pain with use.  MRI shows ligament damage at mp joint.  He wishes to have left small finger ligament repair, both radial and ulnar if necessary.  Allergies: No Known Allergies  Past Medical History:  Diagnosis Date  . Arthritis    knees  . Diabetes mellitus without complication ([place])   . Hypertension     Past Surgical History:  Procedure Laterality Date  . CLOSED REDUCTION FINGER WITH PERCUTANEOUS PINNING Left 01/02/2017   Procedure: CLOSED REDUCTION FINGER WITH PERCUTANEOUS PINNING LEFT SMALL FINGER;  Surgeon: Leanora Cover, MD;  Location: [facility];  Service: Orthopedics;  Laterality: Left;  . CYST EXCISION Right 04/2000  . KNEE ARTHROSCOPY W/ MENISCAL REPAIR Left 05/09/2016    Family History: History reviewed. No pertinent family history.  Social History:   reports that he has never smoked. He has never used smokeless tobacco. He reports that he does not drink alcohol or use drugs.  Medications: Medications Prior to Admission  Medication Sig Dispense Refill  . amlodipine-atorvastatin (CADUET) 10-10 MG tablet Take 1 tablet by mouth daily.    Marland Kitchen aspirin 81 MG chewable tablet Chew by mouth daily.    . bisoprolol-hydrochlorothiazide (ZIAC) 5-6.25 MG tablet Take 1 tablet by mouth daily.    . cyclobenzaprine (FLEXERIL) 5 MG tablet Take 5 mg by mouth 3 (three) times daily as needed for muscle spasms.    . enalapril (VASOTEC) 10 MG tablet Take 10 mg by mouth daily.    Marland Kitchen glimepiride (AMARYL) 4 MG tablet Take 4 mg by mouth daily with breakfast.    . HYDROcodone-acetaminophen (NORCO) 5-325 MG tablet 1-2 tabs po q6 hours prn pain 30 tablet 0  . HYDROcodone-acetaminophen (NORCO/VICODIN) 5-325 MG tablet Take 1 tablet by mouth every 6 (six) hours as needed for moderate pain.    Marland Kitchen ibuprofen (ADVIL,MOTRIN) 600 MG tablet Take 600  mg by mouth every 6 (six) hours as needed.    . metFORMIN (GLUCOPHAGE) 1000 MG tablet Take 1,000 mg by mouth 2 (two) times daily with a meal.    . Multiple Vitamin (MULTIVITAMIN) tablet Take 1 tablet by mouth daily.      No results found for this or any previous visit (from the past 48 hour(s)).  No results found.   A comprehensive review of systems was negative.  Height 5\' 8"  (1.727 m), weight 108 kg.  General appearance: alert, cooperative and appears stated age Head: Normocephalic, without obvious abnormality, atraumatic Neck: supple, symmetrical, trachea midline Cardio: regular rate and rhythm Resp: clear to auscultation bilaterally Extremities: Intact sensation and capillary refill all digits.  +epl/fpl/io.  No wounds.  Pulses: 2+ and symmetric Skin: Skin color, texture, turgor normal. No rashes or lesions Neurologic: Grossly normal Incision/Wound: none  Assessment/Plan Left small finger mp joint collateral ligament injury.  Non operative and operative treatment options have been discussed with the patient and patient wishes to proceed with operative treatment. Plan repair of radial and ulnar collateral ligaments as necessary.  Risks, benefits, and alternatives of surgery have been discussed and the patient agrees with the plan of care.   Leanora Cover 11/12/2018, 10:16 AM

## 2018-11-12 NOTE — Op Note (Signed)
NAME: William Huang MEDICAL RECORD NO: [id number] DATE OF BIRTH: 1971/02/03 FACILITY: [facility] LOCATION:  [facility] PHYSICIAN: William Must, MD   OPERATIVE REPORT   DATE OF PROCEDURE: 11/12/18    PREOPERATIVE DIAGNOSIS:   Left small finger collateral ligament injury   POSTOPERATIVE DIAGNOSIS:   Left small finger radial and ulnar collateral ligament injuries   PROCEDURE:   1.  Left small finger repair of radial collateral ligament at metacarpal head 2.  Left small finger repair of ulnar collateral ligament at proximal phalanx base   SURGEON:  William Huang, M.D.   ASSISTANT: William Brod, MD   ANESTHESIA:  Regional with sedation   INTRAVENOUS FLUIDS:  Per anesthesia flow sheet.   ESTIMATED BLOOD LOSS:  Minimal.   COMPLICATIONS:  None.   SPECIMENS:  none   TOURNIQUET TIME:    Total Tourniquet Time Documented: Upper Arm (Left) - 36 minutes Total: Upper Arm (Left) - 36 minutes    DISPOSITION:  Stable to PACU.   INDICATIONS: 48 year old male status post closed action pin fixation of small finger fracture with continued pain in the MP joint.  MRI shows partial tear of collateral ligament on the ulnar side.  He continues to have pain in both the radial and ulnar sides.  He wishes to undergo collateral ligament repair versus reconstruction possibly both radial and ulnar. Risks, benefits and alternatives of surgery were discussed including the risks of blood loss, infection, damage to nerves, vessels, tendons, ligaments, bone for surgery, need for additional surgery, complications with wound healing, continued pain, nonunion, malunion, stiffness.  He voiced understanding of these risks and elected to proceed.  OPERATIVE COURSE:  After being identified preoperatively by myself,  the patient and I agreed on the procedure and site of the procedure.  The surgical site was marked.  Surgical consent had been signed. He was given IV antibiotics as preoperative antibiotic  prophylaxis. He was transferred to the operating room and placed on the operating table in supine position with the Left upper extremity on an arm board.  Sedation was induced by the anesthesiologist. A regional block had been performed by anesthesia in preoperative holding.   Left upper extremity was prepped and draped in normal sterile orthopedic fashion.  A surgical pause was performed between the surgeons, anesthesia, and operating room staff and all were in agreement as to the patient, procedure, and site of procedure.  Tourniquet at the proximal aspect of the extremity was inflated to 250 mmHg after exsanguination of the arm with an Esmarch bandage.    Incision was made on the dorsum of the small finger at the MP joint.  This is carried in subcutaneous tissues by spreading technique.  The extensor tendon was split longitudinally.  The capsule was opened longitudinally.  The radial collateral ligament was identified.  It had good attachment at the base of the proximal phalanx but had a tear at the origin at the metacarpal head.  The ulnar collateral ligament had good origin at the metacarpal head but had partially torn off from the proximal phalanx base.  It was felt repair of both collateral ligaments would be appropriate.  A 1.0 mm juggernaut suture anchor was placed in the sulcus at the radial side of the metacarpal head.  This was used to suture the torn ligament back down into the sulcus.  The sulcus had been roughened up with the elevator to provide a good bed.  There was good reapproximation of the collateral ligament into  the sulcus.  An additional 1.0 mm juggernaut suture anchor was placed into the base of the proximal phalanx of the ulnar side.  Again the footprint was able to be roughened up slightly.  The sutures were used to reapproximate the ligament to the proximal phalanx base.  Good reapproximation was obtained.  Tightening was done with the finger flexed approximately 45 degrees.  There was  good stability to both the radial and ulnar collateral ligaments.  The wound was copiously irrigated with sterile saline.  The capsule was repaired with a running chromic suture.  The extensor tendon was reapproximated using a 4-0 running Mersilene suture.  The skin was closed with 4-0 nylon in a horizontal mattress fashion.  The wound was dressed with sterile Xeroform 4 x 4's and wrapped with a Kerlix bandage.  Volar and dorsal slab splint including the long ring and small fingers was placed with the MPs flexed and the IP is extended.  This was wrapped with Kerlix and Ace bandage.  The tourniquet was deflated at 36 minutes.  Fingertips were pink with brisk capillary refill after deflation of tourniquet.  The operative  drapes were broken down.  The patient was awoken from anesthesia safely.  He was transferred back to the stretcher and taken to PACU in stable condition.  I will see him back in the office in 1 week for postoperative followup.  I will give him a prescription for Norco 5/325 1-2 tabs PO q6 hours prn pain, dispense # 30.   William LoaKevin Ashwika Freels, MD Electronically signed, 11/12/18

## 2018-11-12 NOTE — Anesthesia Procedure Notes (Signed)
Anesthesia Regional Block: Supraclavicular block   Pre-Anesthetic Checklist: ,, timeout performed, Correct Patient, Correct Site, Correct Laterality, Correct Procedure, Correct Position, site marked, Risks and benefits discussed,  Surgical consent,  Pre-op evaluation,  At surgeon's request and post-op pain management  Laterality: Left  Prep: chloraprep       Needles:  Injection technique: Single-shot  Needle Type: Echogenic Stimulator Needle     Needle Length: 9cm  Needle Gauge: 21     Additional Needles:   Procedures:,,,, ultrasound used (permanent image in chart),,,,  Narrative:  Start time: 11/12/2018 11:20 AM End time: 11/12/2018 11:27 AM Injection made incrementally with aspirations every 5 mL.  Performed by: Personally  Anesthesiologist: Lidia Collum, MD  Additional Notes: Monitors applied. Injection made in 5cc increments. No resistance to injection. Good needle visualization. Patient tolerated procedure well.

## 2018-11-12 NOTE — Anesthesia Preprocedure Evaluation (Addendum)
Anesthesia Evaluation  Patient identified by MRN, date of birth, ID band Patient awake    Reviewed: Allergy & Precautions, NPO status , Patient's Chart, lab work & pertinent test results, reviewed documented beta blocker date and time   History of Anesthesia Complications Negative for: history of anesthetic complications  Airway Mallampati: II  TM Distance: >3 FB Neck ROM: Full    Dental  (+) Teeth Intact   Pulmonary neg pulmonary ROS,    Pulmonary exam normal        Cardiovascular hypertension, Pt. on medications and Pt. on home beta blockers Normal cardiovascular exam     Neuro/Psych negative neurological ROS  negative psych ROS   GI/Hepatic negative GI ROS, Neg liver ROS,   Endo/Other  diabetes, Type 2, Oral Hypoglycemic Agents  Renal/GU negative Renal ROS  negative genitourinary   Musculoskeletal  (+) Arthritis ,   Abdominal   Peds  Hematology negative hematology ROS (+)   Anesthesia Other Findings   Reproductive/Obstetrics                            Anesthesia Physical Anesthesia Plan  ASA: II  Anesthesia Plan: MAC and Regional   Post-op Pain Management:  Regional for Post-op pain   Induction:   PONV Risk Score and Plan: 1 and Propofol infusion and Treatment may vary due to age or medical condition  Airway Management Planned: Nasal Cannula and Simple Face Mask  Additional Equipment: None  Intra-op Plan:   Post-operative Plan:   Informed Consent: I have reviewed the patients History and Physical, chart, labs and discussed the procedure including the risks, benefits and alternatives for the proposed anesthesia with the patient or authorized representative who has indicated his/her understanding and acceptance.     Dental advisory given  Plan Discussed with:   Anesthesia Plan Comments:        Anesthesia Quick Evaluation

## 2018-11-12 NOTE — Op Note (Signed)
I assisted Surgeon(s) and Role:    Leanora Cover, MD - Primary on the Procedure(s): LEFT SMALL FINGER COLLATERAL LIGAMENT REPAIR VERSES RECONSTRUCTION on 11/12/2018.  I provided assistance on this case as follows: setup, approach, exploration,  Repair of ligaments, closure and application of the dressings and splint. Electronically signed by: Daryll Brod, MD Date: 11/12/2018 Time: 12:58 PM

## 2018-11-13 ENCOUNTER — Encounter (HOSPITAL_BASED_OUTPATIENT_CLINIC_OR_DEPARTMENT_OTHER): Payer: Self-pay | Admitting: Orthopedic Surgery

## 2019-11-14 IMAGING — MR MR LUMBAR SPINE W/O CM
5 series · 35 of 48 positions shown · non-contrast
Comparison: 06/18/2017 abdominal ultrasound

CLINICAL DATA: 47 y/o  M; 2 months of lower back pain.

EXAM:
MRI LUMBAR SPINE WITHOUT CONTRAST
TECHNIQUE: Multiplanar, multisequence MR imaging of the lumbar spine was
performed. No intravenous contrast was administered.

[Series 2: T2 · sagittal · 4.0mm · 0.81mm/px · 6 of 17 slices shown (1 of 2)]
[im 1/17]
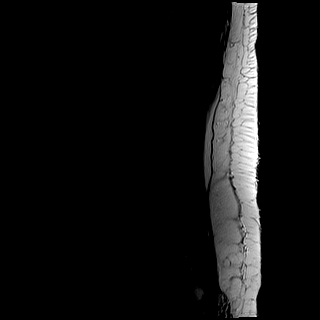
[im 4/17]
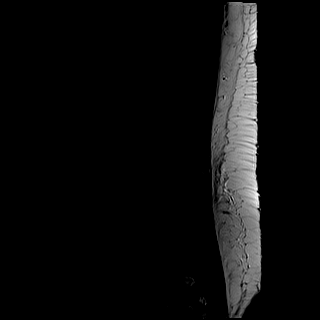
[im 7/17]
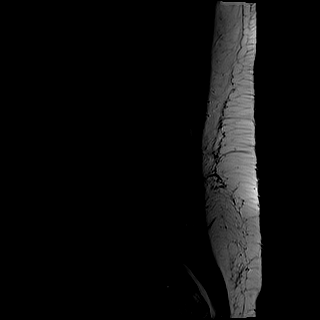
[im 10/17]
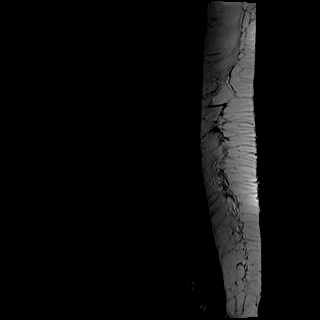
[im 13/17]
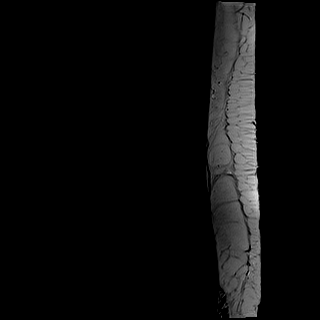
[im 17/17]
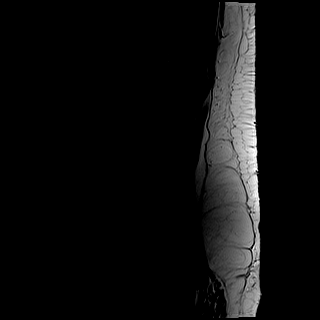

[Series 3: T1 · sagittal · 4.0mm · 0.81mm/px · 6 of 17 slices shown (1 of 2)]
[im 1/17]
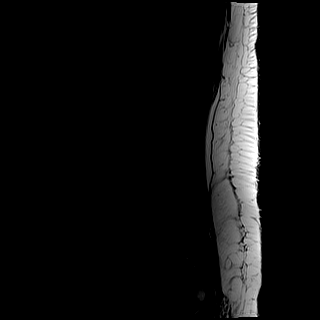
[im 4/17]
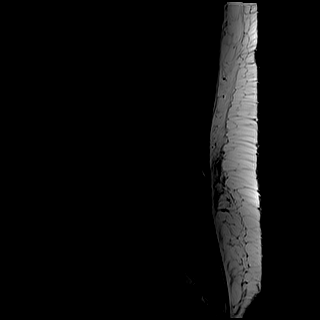
[im 7/17]
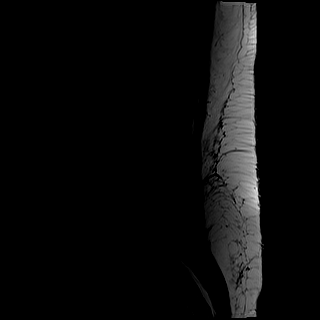
[im 10/17]
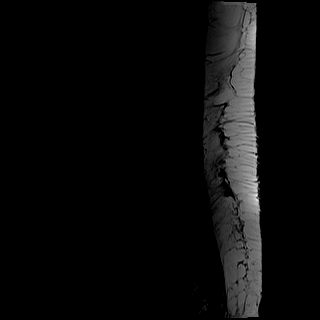
[im 13/17]
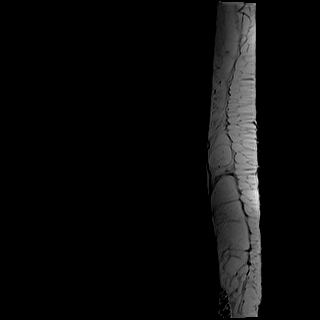
[im 17/17]
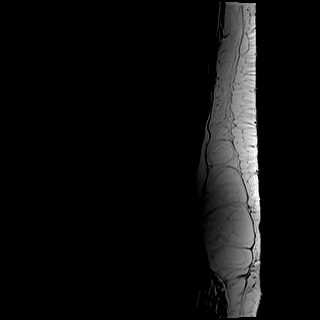

[Series 4: STIR · sagittal · 4.0mm · 0.81mm/px · 5 of 17 slices shown]
[im 1/17]
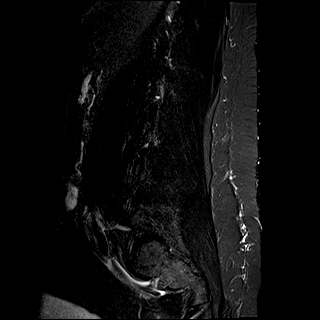
[im 4/17]
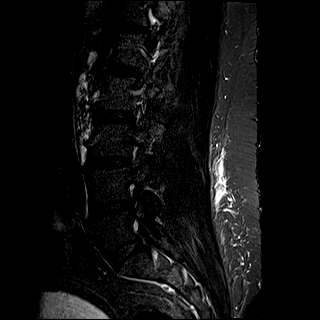
[im 7/17]
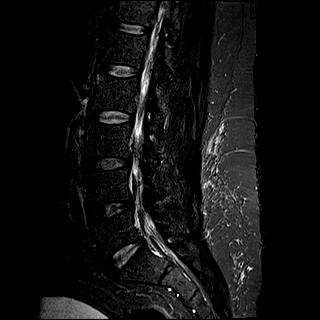
[im 10/17]
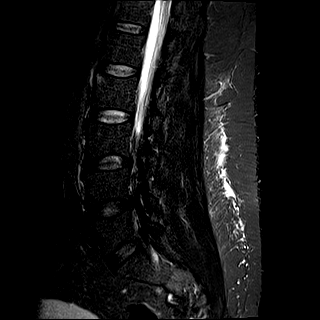
[im 13/17]
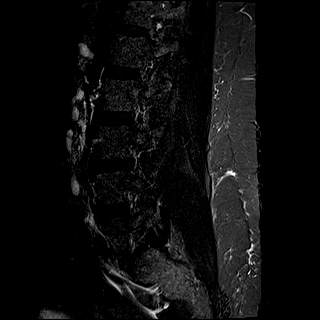

[Series 5: T2 · axial · 4.0mm · 0.78mm/px · z∈[-128,+101]mm · 9 of 43 slices shown (2 of 2)]
[im 1/43]
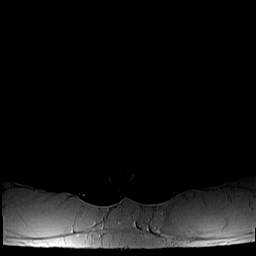
[im 7/43]
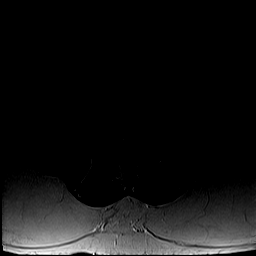
[im 13/43]
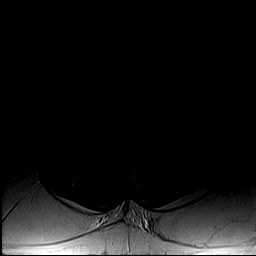
[im 19/43]
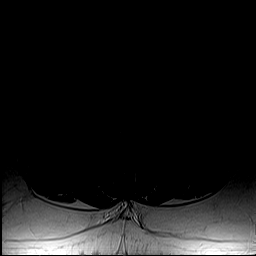
[im 22/43]
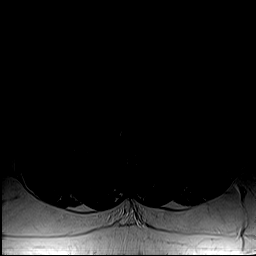
[im 25/43]
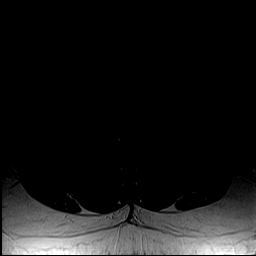
[im 31/43]
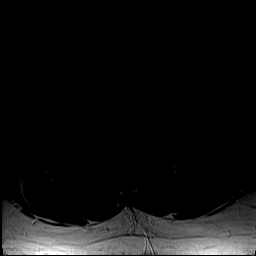
[im 37/43]
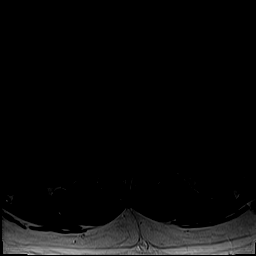
[im 43/43]
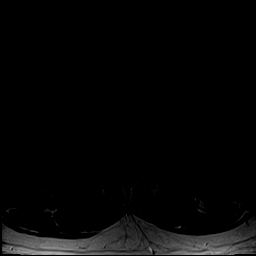

[Series 6: T1 · axial · 4.0mm · 0.39mm/px · z∈[-128,+101]mm · 9 of 43 slices shown (2 of 2)]
[im 1/43]
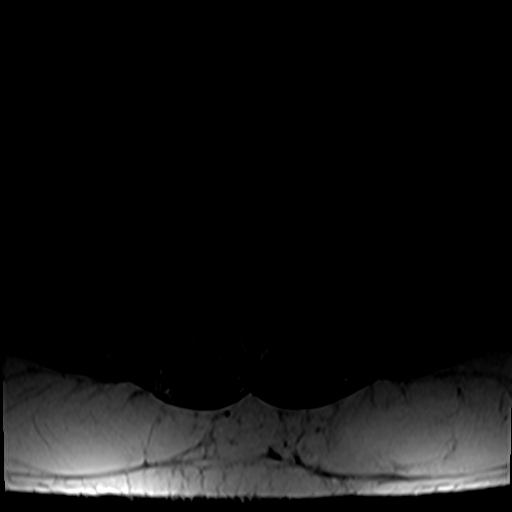
[im 7/43]
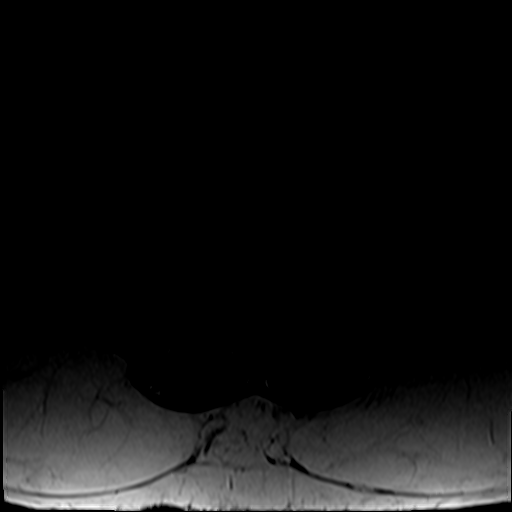
[im 13/43]
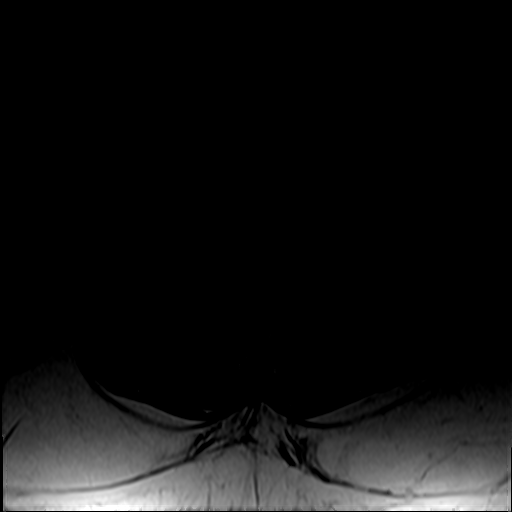
[im 19/43]
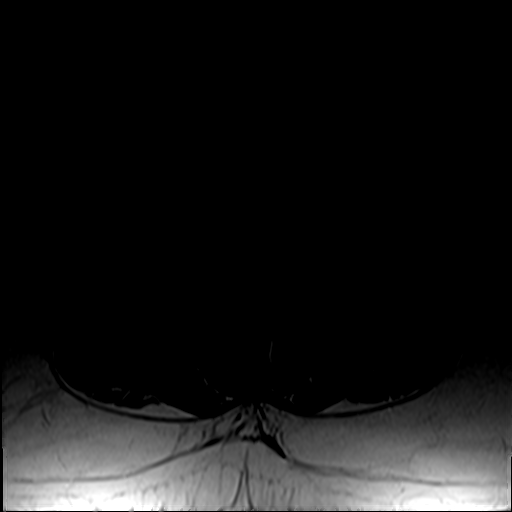
[im 22/43]
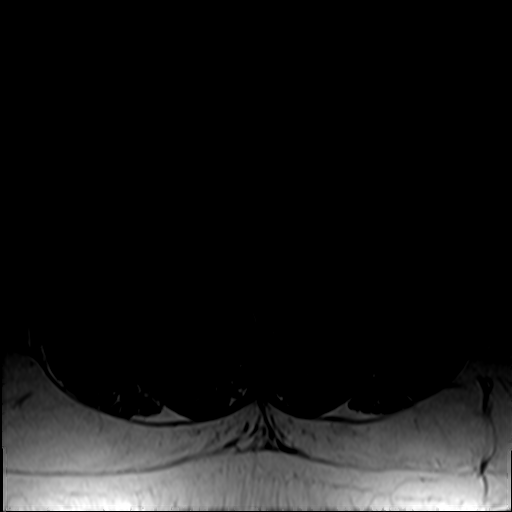
[im 25/43]
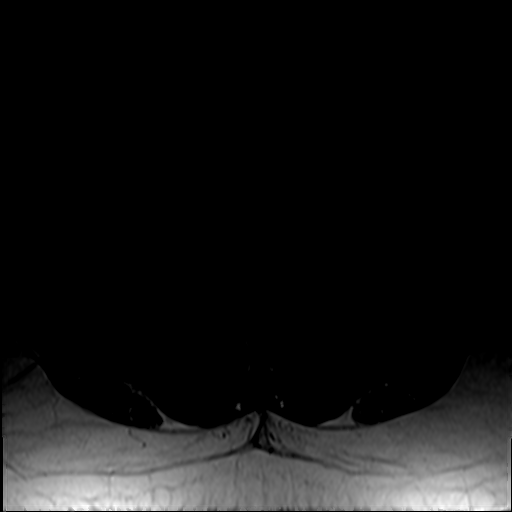
[im 31/43]
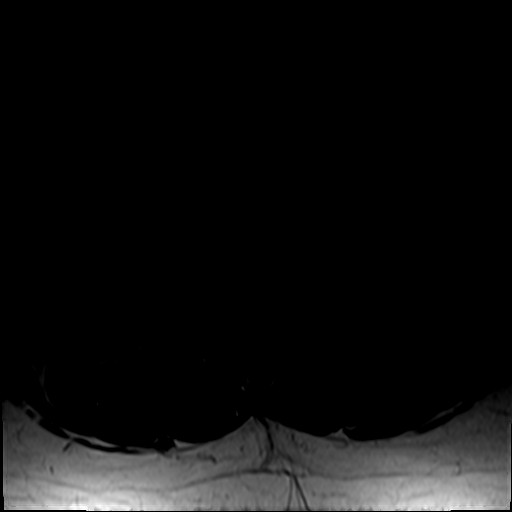
[im 37/43]
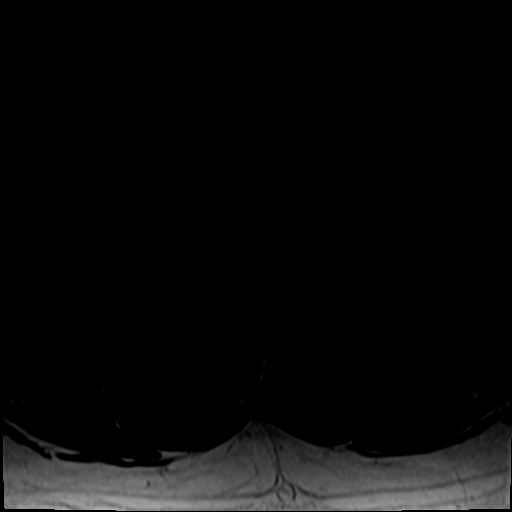
[im 43/43]
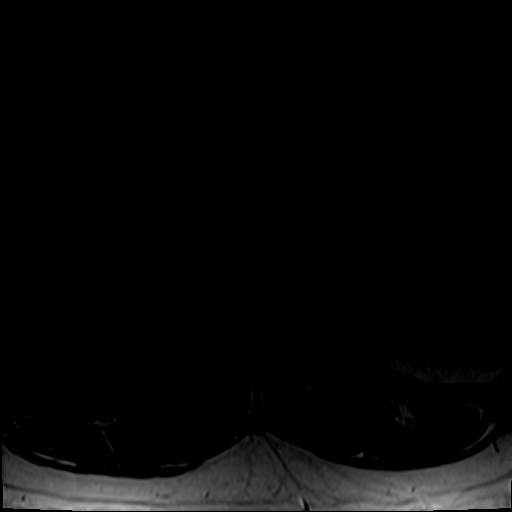

[35 of 48 positions shown; findings below may reference images not displayed]

FINDINGS: Segmentation:  Standard.

Alignment:  Physiologic.

Vertebrae:  No fracture, evidence of discitis, or bone lesion.

Conus medullaris and cauda equina: Conus extends to the L1 level.
Conus and cauda equina appear normal.

Paraspinal and other soft tissues: 14 mm left renal cysts, stable
given differences in technique.

Disc levels:

L1-2: No significant disc displacement, foraminal stenosis, or canal
stenosis.

L2-3: No significant disc displacement, foraminal stenosis, or canal
stenosis.

L3-4: Small disc bulge and left foraminal disc protrusion. Mild
right foraminal stenosis. Mild canal stenosis.

L4-5: Moderate disc bulge with facet and ligamentum flavum
hypertrophy. Mild bilateral foraminal stenosis. Moderate canal
stenosis with lateral recess effacement and disc contact on
descending L5 nerve roots.

L5-S1: No significant disc displacement, foraminal stenosis, or
canal stenosis. Mild facet hypertrophy.
IMPRESSION: 1. No acute osseous abnormality.
2. Mild lumbar spondylosis greatest at L3-4 and L4-5 levels.
3. Mild L3-4 and moderate L4-5 canal stenosis.
4. Mild right L3-4 and bilateral L4-5 foraminal stenosis.

By: Santino Og M.D.
